# Patient Record
Sex: Female | Born: 1940 | Race: White | Hispanic: No | Marital: Married | Smoking: Former smoker
Health system: Southern US, Community
[De-identification: ages and names within clinical notes are randomized; demographics above are authoritative.]

## PROBLEM LIST (undated history)

## (undated) DIAGNOSIS — E039 Hypothyroidism, unspecified: Secondary | ICD-10-CM

## (undated) DIAGNOSIS — I10 Essential (primary) hypertension: Secondary | ICD-10-CM

## (undated) DIAGNOSIS — M1990 Unspecified osteoarthritis, unspecified site: Secondary | ICD-10-CM

## (undated) DIAGNOSIS — E7800 Pure hypercholesterolemia, unspecified: Secondary | ICD-10-CM

## (undated) DIAGNOSIS — M778 Other enthesopathies, not elsewhere classified: Secondary | ICD-10-CM

## (undated) HISTORY — DX: Essential (primary) hypertension: I10

## (undated) HISTORY — PX: KNEE SURGERY: SHX244

## (undated) HISTORY — PX: ABDOMINAL HYSTERECTOMY: SHX81

---

## 1997-07-18 ENCOUNTER — Other Ambulatory Visit: Admission: RE | Admit: 1997-07-18 | Discharge: 1997-07-18 | Payer: Self-pay | Admitting: *Deleted

## 1998-10-04 ENCOUNTER — Emergency Department (HOSPITAL_COMMUNITY): Admission: EM | Admit: 1998-10-04 | Discharge: 1998-10-04 | Payer: Self-pay | Admitting: Emergency Medicine

## 1998-10-04 ENCOUNTER — Encounter: Payer: Self-pay | Admitting: Emergency Medicine

## 1999-08-20 ENCOUNTER — Other Ambulatory Visit: Admission: RE | Admit: 1999-08-20 | Discharge: 1999-08-20 | Payer: Self-pay | Admitting: *Deleted

## 2001-02-24 HISTORY — PX: APPENDECTOMY: SHX54

## 2002-01-31 ENCOUNTER — Observation Stay (HOSPITAL_COMMUNITY): Admission: EM | Admit: 2002-01-31 | Discharge: 2002-02-01 | Payer: Self-pay | Admitting: Emergency Medicine

## 2002-01-31 ENCOUNTER — Encounter: Payer: Self-pay | Admitting: General Surgery

## 2005-10-12 ENCOUNTER — Emergency Department (HOSPITAL_COMMUNITY): Admission: EM | Admit: 2005-10-12 | Discharge: 2005-10-12 | Payer: Self-pay | Admitting: Emergency Medicine

## 2010-10-04 ENCOUNTER — Encounter (INDEPENDENT_AMBULATORY_CARE_PROVIDER_SITE_OTHER): Payer: Self-pay | Admitting: Surgery

## 2014-06-05 ENCOUNTER — Other Ambulatory Visit: Payer: Self-pay | Admitting: Orthopedic Surgery

## 2014-06-09 ENCOUNTER — Encounter (HOSPITAL_BASED_OUTPATIENT_CLINIC_OR_DEPARTMENT_OTHER): Payer: Self-pay | Admitting: *Deleted

## 2014-06-12 ENCOUNTER — Encounter (HOSPITAL_BASED_OUTPATIENT_CLINIC_OR_DEPARTMENT_OTHER)
Admission: RE | Admit: 2014-06-12 | Discharge: 2014-06-12 | Disposition: A | Discharge status: Home / Self Care | Payer: Medicare Other | Source: Ambulatory Visit | Attending: Orthopedic Surgery | Admitting: Orthopedic Surgery

## 2014-06-12 ENCOUNTER — Other Ambulatory Visit: Payer: Self-pay | Admitting: Internal Medicine

## 2014-06-12 ENCOUNTER — Ambulatory Visit (INDEPENDENT_AMBULATORY_CARE_PROVIDER_SITE_OTHER): Payer: Medicare Other | Admitting: Internal Medicine

## 2014-06-12 ENCOUNTER — Encounter: Payer: Self-pay | Admitting: Internal Medicine

## 2014-06-12 VITALS — BP 116/70 | HR 69 | Ht 62.0 in | Wt 143.4 lb

## 2014-06-12 DIAGNOSIS — Z791 Long term (current) use of non-steroidal anti-inflammatories (NSAID): Secondary | ICD-10-CM | POA: Diagnosis not present

## 2014-06-12 DIAGNOSIS — Z01818 Encounter for other preprocedural examination: Secondary | ICD-10-CM

## 2014-06-12 DIAGNOSIS — R0600 Dyspnea, unspecified: Secondary | ICD-10-CM | POA: Diagnosis not present

## 2014-06-12 DIAGNOSIS — R9431 Abnormal electrocardiogram [ECG] [EKG]: Secondary | ICD-10-CM | POA: Diagnosis not present

## 2014-06-12 DIAGNOSIS — R001 Bradycardia, unspecified: Secondary | ICD-10-CM | POA: Diagnosis not present

## 2014-06-12 DIAGNOSIS — E780 Pure hypercholesterolemia: Secondary | ICD-10-CM | POA: Diagnosis not present

## 2014-06-12 DIAGNOSIS — M7712 Lateral epicondylitis, left elbow: Secondary | ICD-10-CM | POA: Diagnosis not present

## 2014-06-12 DIAGNOSIS — M1990 Unspecified osteoarthritis, unspecified site: Secondary | ICD-10-CM | POA: Diagnosis not present

## 2014-06-12 DIAGNOSIS — M25522 Pain in left elbow: Secondary | ICD-10-CM | POA: Diagnosis present

## 2014-06-12 DIAGNOSIS — I10 Essential (primary) hypertension: Secondary | ICD-10-CM | POA: Diagnosis not present

## 2014-06-12 DIAGNOSIS — Z7982 Long term (current) use of aspirin: Secondary | ICD-10-CM | POA: Diagnosis not present

## 2014-06-12 DIAGNOSIS — Z79899 Other long term (current) drug therapy: Secondary | ICD-10-CM | POA: Diagnosis not present

## 2014-06-12 DIAGNOSIS — Z87891 Personal history of nicotine dependence: Secondary | ICD-10-CM | POA: Diagnosis not present

## 2014-06-12 DIAGNOSIS — E039 Hypothyroidism, unspecified: Secondary | ICD-10-CM | POA: Diagnosis not present

## 2014-06-12 LAB — CBC
HCT: 42.4 % (ref 36.0–46.0)
Hemoglobin: 14.2 g/dL (ref 12.0–15.0)
MCHC: 33.5 g/dL (ref 30.0–36.0)
MCV: 89.3 fl (ref 78.0–100.0)
Platelets: 244 10*3/uL (ref 150.0–400.0)
RBC: 4.75 Mil/uL (ref 3.87–5.11)
RDW: 14.7 % (ref 11.5–15.5)
WBC: 7.2 10*3/uL (ref 4.0–10.5)

## 2014-06-12 LAB — BASIC METABOLIC PANEL
Anion gap: 8 (ref 5–15)
BUN: 19 mg/dL (ref 6–23)
BUN: 22 mg/dL (ref 6–23)
CO2: 25 mmol/L (ref 19–32)
CO2: 26 mEq/L (ref 19–32)
Calcium: 9.8 mg/dL (ref 8.4–10.5)
Calcium: 10.6 mg/dL — ABNORMAL HIGH (ref 8.4–10.5)
Chloride: 107 mmol/L (ref 96–112)
Chloride: 106 mEq/L (ref 96–112)
Creatinine, Ser: 0.74 mg/dL (ref 0.50–1.10)
Creatinine, Ser: 0.77 mg/dL (ref 0.40–1.20)
GFR calc Af Amer: >90 mL/min (ref >90)
GFR calc non Af Amer: 81 mL/min — ABNORMAL LOW (ref >90)
GFR: 77.54 mL/min (ref >60.00)
Glucose, Bld: 107 mg/dL — ABNORMAL HIGH (ref 70–99)
Glucose, Bld: 101 mg/dL — ABNORMAL HIGH (ref 70–99)
Potassium: 4.7 mmol/L (ref 3.5–5.1)
Potassium: 3.9 mEq/L (ref 3.5–5.1)
SODIUM: 140 mmol/L (ref 135–145)
Sodium: 139 mEq/L (ref 135–145)

## 2014-06-12 LAB — BRAIN NATRIURETIC PEPTIDE: Pro B Natriuretic peptide (BNP): 52 pg/mL (ref 0.0–100.0)

## 2014-06-12 NOTE — Progress Notes (Signed)
Dr. Ivin Bootyrews reviewed EKG, spoke with pt about results of EKG that she has had an MI at some time, does not feel comfortable doing surgery until evaluated by cardiologist. [facility] [place] [facility] to [facility] for  before her surgery on Wednesday. Melissa called pt and then notified me around 11 am today that pt has appointment today at 2pm with Dr. Johney FrameAllred Cardiologist.

## 2014-06-12 NOTE — Patient Instructions (Signed)
We will be checking the following labs today BMET, CBC & CBC  We will arrange for a Lexiscan Myoview  If not able to do the stress test tomorrow, cancel your surgery  Stay on your current medicines  Call the [facility] office at [phone] if you have any questions, problems or concerns.

## 2014-06-12 NOTE — Progress Notes (Signed)
CARDIOLOGY OFFICE NOTE  Date:  06/12/2014    Rachel OchFaye Angelica Date of Birth: Dec 23, 1940 Medical Record #[id number]  PCP:  No primary care provider on file.  Cardiologist:  Allred (new)    Chief Complaint  Patient presents with  . Pre-op Exam    Work in visit for abnormal EKG - seen for Dr. Johney FrameAllred.      History of Present Illness: Rachel [name] is a 74 y.o. female who presents today for a work in visit/pre op clearance. New to Dr. Johney FrameAllred. She has a history of HTN and HLD along with hypothyroidism.  [place] Her daughter was in a group home here, now trying to relocate to another group home but to no avail. She has been under added stress with this. 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   She was to have elbow surgery on Wednesday with Dr. Luiz BlareGraves - she is not sure about what anesthesia. Has limited mobility of the left arm. EKG there with pre op today with possible inferior MI.  She has never seen cardiology. She does endorse a long history of dyspnea - attributed to deconditioning and weight gain. Says she can go about 90 feet and will get SOB. This is not new. Not getting worse. Has had some atypical left sided chest pain as well - this has been off and on. Says she thought she may have had an MI several years ago - did not seek emergent care - went to her PCP - referred for stress test and was told that she was ok. No recent labs. No regular exercise. Rare palpitation. No regular exercise program. Limited by knee and OA pain/discomforts.    Past Medical History  Diagnosis Date  . Hypothyroidism   . Arthritis   . Hypercholesteremia   . Tendonitis of elbow, left   . HTN (hypertension)     Past Surgical History  Procedure Laterality Date  . Appendectomy  2003  . Abdominal hysterectomy      1978     Medications: Current Outpatient Prescriptions    Medication Sig Dispense Refill  . aspirin 81 MG tablet Take 81 mg by mouth daily.    Marland Kitchen. levothyroxine (SYNTHROID, LEVOTHROID) 50 MCG tablet Take 100 mcg by mouth daily before breakfast.     . lisinopril (PRINIVIL,ZESTRIL) 10 MG tablet Take 10 mg by mouth daily.    . Naproxen Sodium 220 MG CAPS Take 1 capsule by mouth as needed.    Marland Kitchen. omeprazole (PRILOSEC) 20 MG capsule Take 20 mg by mouth as needed.    . pravastatin (PRAVACHOL) 40 MG tablet Take 40 mg by mouth daily.     No current facility-administered medications for this visit.    Allergies: Allergies  Allergen Reactions  . Codeine Palpitations  . Morphine And Related Palpitations    Social History: The patient  reports that she has quit smoking. She has never used smokeless tobacco. She reports that she drinks alcohol.   Family History: The patient's family history includes Heart attack (age of onset: 1776) in her father; Hypertension in her mother; Stroke (age of onset: 5390) in her mother.   Review of Systems: Please see the history of present illness.   Otherwise, the review of systems is positive for fatigue.   All other systems are reviewed and negative.   Physical Exam: VS:  BP 116/70 mmHg  Pulse 69  Ht  (1.575 m)  Wt 143 lb 6.4 oz (65.046 kg)  BMI 26.22 kg/m2 .  BMI Body mass index is 26.22 kg/(m^2).  Wt Readings from Last 3 Encounters:  06/12/14 143 lb 6.4 oz (65.046 kg)  06/09/14 142 lb (64.411 kg)    General: Pleasant. Well developed, well nourished and in no acute distress. Little tearful.  HEENT: Normal. Neck: Supple, no JVD, carotid bruits, or masses noted.  Cardiac: Regular rate and rhythm. No murmurs, rubs, or gallops. No edema.  Respiratory:  Lungs are clear to auscultation bilaterally with normal work of breathing.  GI: Soft and nontender.  MS: No deformity or atrophy. Gait and ROM intact. Skin: Warm and dry. Color is normal.  Neuro:  Strength and sensation are intact and no gross focal deficits  noted.  Psych: Alert, appropriate and with normal affect.   LABORATORY DATA:  EKG:  EKG is ordered today. This demonstrates NSR. Low voltage. No acute changes. Reviewed with Dr. Johney Frame (DOD).  Lab Results  Component Value Date   GLUCOSE 107* 06/12/2014   NA 140 06/12/2014   K 4.7 06/12/2014   CL 107 06/12/2014   CREATININE 0.74 06/12/2014   BUN 19 06/12/2014   CO2 25 06/12/2014    BNP (last 3 results) No results for input(s): BNP in the last 8760 hours.  ProBNP (last 3 results) No results for input(s): PROBNP in the last 8760 hours.   Other Studies Reviewed Today: N/A  Assessment/Plan: 1. Pre op clearance - EKG here in the office not all that impressive but she does endorse dyspnea and chest pain - will arrange for Lexiscan (not able to walk due to osteoarthritis). Will try to do tomorrow but her surgery may need to be postponed.   2. Atypical chest pain -  Arranging Myoview  3. DOE - checking lab today - arranging Myoview  4. HTN - controlled  5. HLD - on statin therapy  6. Situational stress - discussed at length.   She is asking about new PCP - I have given her the name and number of Dr. Cyndia Bent.   Current medicines are reviewed with the patient today.  The patient does not have concerns regarding medicines other than what has been noted above.  The following changes have been made:  See above.  Labs/ tests ordered today include:    Orders Placed This Encounter  Procedures  . Basic metabolic panel  . CBC  . Brain natriuretic peptide  . Myocardial Perfusion Imaging  . EKG 12-Lead     Disposition:   Further disposition to follow.  Patient is agreeable to this plan and will call if any problems develop in the interim.   Signed: Rosalio Macadamia, RN, ANP-C 06/12/2014 3:49 PM  [facility] [address] [place], [place]  [address] Phone: [phone] Fax: [phone]

## 2014-06-13 ENCOUNTER — Telehealth: Payer: Self-pay | Admitting: *Deleted

## 2014-06-13 ENCOUNTER — Encounter (HOSPITAL_COMMUNITY)
Admission: RE | Admit: 2014-06-13 | Discharge: 2014-06-13 | Disposition: A | Discharge status: Home / Self Care | Payer: Medicare Other | Source: Ambulatory Visit | Attending: Internal Medicine | Admitting: Internal Medicine

## 2014-06-13 ENCOUNTER — Other Ambulatory Visit: Payer: Self-pay

## 2014-06-13 ENCOUNTER — Encounter (HOSPITAL_COMMUNITY): Admission: RE | Admit: 2014-06-13 | Payer: Medicare Other | Source: Ambulatory Visit

## 2014-06-13 DIAGNOSIS — R0602 Shortness of breath: Secondary | ICD-10-CM

## 2014-06-13 DIAGNOSIS — R9431 Abnormal electrocardiogram [ECG] [EKG]: Secondary | ICD-10-CM | POA: Diagnosis not present

## 2014-06-13 MED ORDER — TECHNETIUM TC 99M SESTAMIBI GENERIC - CARDIOLITE
30.0000 | Freq: Once | INTRAVENOUS | Status: AC | PRN
  Administered 2014-06-13: 30 via INTRAVENOUS

## 2014-06-13 MED ORDER — TECHNETIUM TC 99M SESTAMIBI GENERIC - CARDIOLITE
10.0000 | Freq: Once | INTRAVENOUS | Status: AC | PRN
  Administered 2014-06-13: 10 via INTRAVENOUS

## 2014-06-13 MED ORDER — ACETAMINOPHEN 325 MG PO TABS
ORAL_TABLET | ORAL | Status: AC
  Filled 2014-06-13: qty 2

## 2014-06-13 MED ORDER — ACETAMINOPHEN 325 MG PO TABS
650.0000 mg | ORAL_TABLET | Freq: Once | ORAL | Status: AC
  Administered 2014-06-13: 650 mg via ORAL

## 2014-06-13 MED ORDER — REGADENOSON 0.4 MG/5ML IV SOLN: 0.4000 mg | Freq: Once | INTRAVENOUS | Status: DC

## 2014-06-13 MED ORDER — REGADENOSON 0.4 MG/5ML IV SOLN
INTRAVENOUS | Status: AC
  Administered 2014-06-13: 0.4 mg
  Filled 2014-06-13: qty 5

## 2014-06-13 NOTE — Progress Notes (Signed)
Lexiscan CL performed. Pt tolerated well.   Theodore Demarkhonda Barrett, PA-C 06/13/2014 10:09 AM Beeper [phone]

## 2014-06-13 NOTE — Telephone Encounter (Signed)
S/w pt is aware pt is cleared for surgical clearance tomorrow. Routed to Dr. Luiz BlareGraves.

## 2014-06-14 ENCOUNTER — Ambulatory Visit (HOSPITAL_BASED_OUTPATIENT_CLINIC_OR_DEPARTMENT_OTHER): Payer: Medicare Other | Admitting: Anesthesiology

## 2014-06-14 ENCOUNTER — Encounter (HOSPITAL_BASED_OUTPATIENT_CLINIC_OR_DEPARTMENT_OTHER): Payer: Self-pay | Admitting: Anesthesiology

## 2014-06-14 ENCOUNTER — Encounter (HOSPITAL_BASED_OUTPATIENT_CLINIC_OR_DEPARTMENT_OTHER)
Admission: RE | Disposition: A | Discharge status: Home / Self Care | Payer: Self-pay | Source: Ambulatory Visit | Attending: Orthopedic Surgery

## 2014-06-14 ENCOUNTER — Ambulatory Visit (HOSPITAL_BASED_OUTPATIENT_CLINIC_OR_DEPARTMENT_OTHER)
Admission: RE | Admit: 2014-06-14 | Discharge: 2014-06-14 | Disposition: A | Discharge status: Home / Self Care | Payer: Medicare Other | Source: Ambulatory Visit | Attending: Orthopedic Surgery | Admitting: Orthopedic Surgery

## 2014-06-14 DIAGNOSIS — Z79899 Other long term (current) drug therapy: Secondary | ICD-10-CM | POA: Insufficient documentation

## 2014-06-14 DIAGNOSIS — Z7982 Long term (current) use of aspirin: Secondary | ICD-10-CM | POA: Insufficient documentation

## 2014-06-14 DIAGNOSIS — Z87891 Personal history of nicotine dependence: Secondary | ICD-10-CM | POA: Insufficient documentation

## 2014-06-14 DIAGNOSIS — M7712 Lateral epicondylitis, left elbow: Secondary | ICD-10-CM | POA: Insufficient documentation

## 2014-06-14 DIAGNOSIS — E039 Hypothyroidism, unspecified: Secondary | ICD-10-CM | POA: Insufficient documentation

## 2014-06-14 DIAGNOSIS — E780 Pure hypercholesterolemia: Secondary | ICD-10-CM | POA: Diagnosis not present

## 2014-06-14 DIAGNOSIS — I10 Essential (primary) hypertension: Secondary | ICD-10-CM | POA: Insufficient documentation

## 2014-06-14 DIAGNOSIS — R9431 Abnormal electrocardiogram [ECG] [EKG]: Secondary | ICD-10-CM | POA: Insufficient documentation

## 2014-06-14 DIAGNOSIS — M1990 Unspecified osteoarthritis, unspecified site: Secondary | ICD-10-CM | POA: Diagnosis not present

## 2014-06-14 DIAGNOSIS — R001 Bradycardia, unspecified: Secondary | ICD-10-CM | POA: Insufficient documentation

## 2014-06-14 DIAGNOSIS — Z791 Long term (current) use of non-steroidal anti-inflammatories (NSAID): Secondary | ICD-10-CM | POA: Insufficient documentation

## 2014-06-14 HISTORY — DX: Hypothyroidism, unspecified: E03.9

## 2014-06-14 HISTORY — DX: Pure hypercholesterolemia, unspecified: E78.00

## 2014-06-14 HISTORY — PX: LATERAL EPICONDYLE RELEASE: SHX1958

## 2014-06-14 HISTORY — DX: Other enthesopathies, not elsewhere classified: M77.8

## 2014-06-14 HISTORY — DX: Unspecified osteoarthritis, unspecified site: M19.90

## 2014-06-14 SURGERY — TENNIS ELBOW RELEASE/NIRSCHEL PROCEDURE
Anesthesia: General | Laterality: Left

## 2014-06-14 MED ORDER — OXYCODONE HCL 5 MG/5ML PO SOLN: 5.0000 mg | Freq: Once | ORAL | Status: DC | PRN

## 2014-06-14 MED ORDER — PROPOFOL 10 MG/ML IV BOLUS
INTRAVENOUS | Status: DC | PRN
  Administered 2014-06-14: 150 mg via INTRAVENOUS

## 2014-06-14 MED ORDER — LIDOCAINE HCL (CARDIAC) 20 MG/ML IV SOLN
INTRAVENOUS | Status: DC | PRN
  Administered 2014-06-14: 60 mg via INTRAVENOUS

## 2014-06-14 MED ORDER — FENTANYL CITRATE (PF) 100 MCG/2ML IJ SOLN
INTRAMUSCULAR | Status: AC
  Filled 2014-06-14: qty 4

## 2014-06-14 MED ORDER — MIDAZOLAM HCL 2 MG/2ML IJ SOLN
INTRAMUSCULAR | Status: AC
  Filled 2014-06-14: qty 2

## 2014-06-14 MED ORDER — DEXAMETHASONE SODIUM PHOSPHATE 10 MG/ML IJ SOLN
INTRAMUSCULAR | Status: DC | PRN
  Administered 2014-06-14: 5 mg via INTRAVENOUS

## 2014-06-14 MED ORDER — MIDAZOLAM HCL 2 MG/2ML IJ SOLN
1.0000 mg | INTRAMUSCULAR | Status: DC | PRN
  Administered 2014-06-14: 1 mg via INTRAVENOUS

## 2014-06-14 MED ORDER — OXYCODONE-ACETAMINOPHEN 5-325 MG PO TABS: 1.0000 | ORAL_TABLET | Freq: Four times a day (QID) | ORAL | Status: AC | PRN

## 2014-06-14 MED ORDER — OXYCODONE HCL 5 MG PO TABS: 5.0000 mg | ORAL_TABLET | Freq: Once | ORAL | Status: DC | PRN

## 2014-06-14 MED ORDER — CEFAZOLIN SODIUM-DEXTROSE 2-3 GM-% IV SOLR
2.0000 g | INTRAVENOUS | Status: AC
  Administered 2014-06-14: 2 g via INTRAVENOUS

## 2014-06-14 MED ORDER — FENTANYL CITRATE (PF) 100 MCG/2ML IJ SOLN
INTRAMUSCULAR | Status: DC | PRN
  Administered 2014-06-14: 50 ug via INTRAVENOUS

## 2014-06-14 MED ORDER — CHLORHEXIDINE GLUCONATE 4 % EX LIQD: 60.0000 mL | Freq: Once | CUTANEOUS | Status: DC

## 2014-06-14 MED ORDER — PROMETHAZINE HCL 25 MG/ML IJ SOLN: 6.2500 mg | INTRAMUSCULAR | Status: DC | PRN

## 2014-06-14 MED ORDER — CEFAZOLIN SODIUM-DEXTROSE 2-3 GM-% IV SOLR
INTRAVENOUS | Status: AC
  Filled 2014-06-14: qty 50

## 2014-06-14 MED ORDER — HYDROMORPHONE HCL 1 MG/ML IJ SOLN
0.2500 mg | INTRAMUSCULAR | Status: DC | PRN
  Administered 2014-06-14: 0.5 mg via INTRAVENOUS
  Administered 2014-06-14 (×3): 0.25 mg via INTRAVENOUS

## 2014-06-14 MED ORDER — HYDROMORPHONE HCL 1 MG/ML IJ SOLN
INTRAMUSCULAR | Status: AC
  Filled 2014-06-14: qty 1

## 2014-06-14 MED ORDER — ONDANSETRON HCL 4 MG/2ML IJ SOLN
INTRAMUSCULAR | Status: DC | PRN
  Administered 2014-06-14: 4 mg via INTRAVENOUS

## 2014-06-14 MED ORDER — BUPIVACAINE HCL (PF) 0.5 % IJ SOLN
INTRAMUSCULAR | Status: DC | PRN
  Administered 2014-06-14: 10 mL

## 2014-06-14 MED ORDER — LACTATED RINGERS IV SOLN
INTRAVENOUS | Status: DC
  Administered 2014-06-14 (×2): via INTRAVENOUS

## 2014-06-14 SURGICAL SUPPLY — 68 items
APL SKNCLS STERI-STRIP NONHPOA (GAUZE/BANDAGES/DRESSINGS) ×1
BANDAGE ELASTIC 3 VELCRO ST LF (GAUZE/BANDAGES/DRESSINGS) ×2 IMPLANT
BANDAGE ELASTIC 4 VELCRO ST LF (GAUZE/BANDAGES/DRESSINGS) ×3 IMPLANT
BENZOIN TINCTURE PRP APPL 2/3 (GAUZE/BANDAGES/DRESSINGS) ×3 IMPLANT
BLADE SURG 15 STRL LF DISP TIS (BLADE) ×1 IMPLANT
BLADE SURG 15 STRL SS (BLADE) ×3
BNDG CMPR 9X4 STRL LF SNTH (GAUZE/BANDAGES/DRESSINGS) ×1
BNDG ESMARK 4X9 LF (GAUZE/BANDAGES/DRESSINGS) ×2 IMPLANT
CANISTER SUCT 1200ML W/VALVE (MISCELLANEOUS) IMPLANT
CLOSURE WOUND 1/2 X4 (GAUZE/BANDAGES/DRESSINGS) ×1
COVER BACK TABLE 60X90IN (DRAPES) ×3 IMPLANT
COVER MAYO STAND STRL (DRAPES) ×3 IMPLANT
CUFF TOURN SGL LL 18 NRW (TOURNIQUET CUFF) ×6 IMPLANT
CUFF TOURNIQUET SINGLE 18IN (TOURNIQUET CUFF) ×2 IMPLANT
DECANTER SPIKE VIAL GLASS SM (MISCELLANEOUS) ×3 IMPLANT
DRAPE EXTREMITY T 121X128X90 (DRAPE) ×3 IMPLANT
DRAPE SURG 17X23 STRL (DRAPES) ×2 IMPLANT
DRAPE U-SHAPE 47X51 STRL (DRAPES) ×3 IMPLANT
DRSG EMULSION OIL 3X3 NADH (GAUZE/BANDAGES/DRESSINGS) ×2 IMPLANT
DURAPREP 26ML APPLICATOR (WOUND CARE) ×3 IMPLANT
ELECT REM PT RETURN 9FT ADLT (ELECTROSURGICAL) ×3
ELECTRODE REM PT RTRN 9FT ADLT (ELECTROSURGICAL) ×1 IMPLANT
GAUZE SPONGE 4X4 12PLY STRL (GAUZE/BANDAGES/DRESSINGS) ×3 IMPLANT
GLOVE BIO SURGEON STRL SZ 6.5 (GLOVE) ×2 IMPLANT
GLOVE BIO SURGEONS STRL SZ 6.5 (GLOVE) ×2
GLOVE BIOGEL PI IND STRL 8 (GLOVE) ×2 IMPLANT
GLOVE BIOGEL PI INDICATOR 8 (GLOVE) ×4
GLOVE ECLIPSE 7.5 STRL STRAW (GLOVE) ×6 IMPLANT
GOWN STRL REUS W/ TWL LRG LVL3 (GOWN DISPOSABLE) ×1 IMPLANT
GOWN STRL REUS W/ TWL XL LVL3 (GOWN DISPOSABLE) ×1 IMPLANT
GOWN STRL REUS W/TWL LRG LVL3 (GOWN DISPOSABLE) ×3
GOWN STRL REUS W/TWL XL LVL3 (GOWN DISPOSABLE) ×6 IMPLANT
K-WIRE .045X4 (WIRE) IMPLANT
LOOP VESSEL MAXI BLUE (MISCELLANEOUS) IMPLANT
NEEDLE HYPO 22GX1.5 SAFETY (NEEDLE) ×3 IMPLANT
NS IRRIG 1000ML POUR BTL (IV SOLUTION) ×3 IMPLANT
PACK BASIN DAY SURGERY FS (CUSTOM PROCEDURE TRAY) ×3 IMPLANT
PAD CAST 4YDX4 CTTN HI CHSV (CAST SUPPLIES) ×2 IMPLANT
PADDING CAST ABS 4INX4YD NS (CAST SUPPLIES) ×2
PADDING CAST ABS COTTON 4X4 ST (CAST SUPPLIES) ×1 IMPLANT
PADDING CAST COTTON 4X4 STRL (CAST SUPPLIES) ×6
PENCIL BUTTON HOLSTER BLD 10FT (ELECTRODE) ×3 IMPLANT
SHEET MEDIUM DRAPE 40X70 STRL (DRAPES) ×2 IMPLANT
SLEEVE SCD COMPRESS KNEE MED (MISCELLANEOUS) ×2 IMPLANT
SLING ARM FOAM STRAP LRG (SOFTGOODS) ×2 IMPLANT
SLING ARM LRG ADULT FOAM STRAP (SOFTGOODS) IMPLANT
SLING ARM MED ADULT FOAM STRAP (SOFTGOODS) IMPLANT
SPLINT FAST PLASTER 5X30 (CAST SUPPLIES)
SPLINT FIBERGLASS 3X35 (CAST SUPPLIES) ×2 IMPLANT
SPLINT PLASTER CAST FAST 5X30 (CAST SUPPLIES) IMPLANT
SPONGE GAUZE 4X4 12PLY STER LF (GAUZE/BANDAGES/DRESSINGS) ×6 IMPLANT
STOCKINETTE 4X48 STRL (DRAPES) ×3 IMPLANT
STRIP CLOSURE SKIN 1/2X4 (GAUZE/BANDAGES/DRESSINGS) ×2 IMPLANT
SUCTION FRAZIER TIP 10 FR DISP (SUCTIONS) IMPLANT
SUT MNCRL AB 3-0 PS2 18 (SUTURE) IMPLANT
SUT MNCRL AB 4-0 PS2 18 (SUTURE) ×2 IMPLANT
SUT VIC AB 0 CT1 27 (SUTURE)
SUT VIC AB 0 CT1 27XBRD ANBCTR (SUTURE) IMPLANT
SUT VIC AB 3-0 SH 27 (SUTURE)
SUT VIC AB 3-0 SH 27X BRD (SUTURE) IMPLANT
SYR BULB 3OZ (MISCELLANEOUS) ×2 IMPLANT
SYR CONTROL 10ML LL (SYRINGE) ×3 IMPLANT
TOWEL OR 17X24 6PK STRL BLUE (TOWEL DISPOSABLE) ×3 IMPLANT
TOWEL OR NON WOVEN STRL DISP B (DISPOSABLE) ×5 IMPLANT
TUBE CONNECTING 20'X1/4 (TUBING)
TUBE CONNECTING 20X1/4 (TUBING) IMPLANT
UNDERPAD 30X30 INCONTINENT (UNDERPADS AND DIAPERS) ×3 IMPLANT
YANKAUER SUCT BULB TIP NO VENT (SUCTIONS) IMPLANT

## 2014-06-14 NOTE — Op Note (Signed)
[name]:  [name], Rachel [name]                 ACCOUNT NO.:  [id number]  MEDICAL RECORD NO.:  [id number]  LOCATION:                               FACILITY:  [facility]  PHYSICIAN:  Harvie JuniorJohn L. Takeya Marquis, M.D.   DATE OF BIRTH:  07-28-1940  DATE OF PROCEDURE:  06/14/2014 DATE OF DISCHARGE:  06/14/2014                              OPERATIVE REPORT   PREOPERATIVE DIAGNOSIS:  Lateral epicondylitis.  POSTOPERATIVE DIAGNOSIS:  Lateral epicondylitis.  PROCEDURE:  Lateral epicondylar release.  SURGEON:  Harvie JuniorJohn L. Ronny Korff, M.D.  Threasa HeadsASSISTANOrma Flaming:  Bethune.  ANESTHESIA:  General.  BRIEF HISTORY:  Rachel [name] is a 74 year old female with a long history of significant complaints of left elbow pain.  She has been treated conservatively for a period of time.  She has had injection therapy, activity modification, all of these did not help her elbow pain. Because of continued elbow pain and failure of conservative care, she was taken to the operating room for lateral epicondylar release.  DESCRIPTION OF PROCEDURE:  The patient was taken to the operating room. After adequate anesthesia was obtained with general anesthetic, the patient was placed supine on the operating table.  Left arm was prepped and draped in usual sterile fashion.  Following this, the arm was exsanguinated, blood pressure tourniquet inflated to 250 mmHg. Following this, an incision was made over the lateral epicondylar area, subcutaneous tissue, and then over the common extensor tendon.  The palpable bony prominence of the epicondyle was identified and an incision was made in the common extensor tendon in its mid portion. Flaps were raised proximally and distally and the ECRB tendon was identified underneath the outer layer.  There was significant gelatinous material in the ECRB tendon.  We then went directly into the elbow joint and irrigated this thoroughly.  We then took a rongeur and debrided the lateral epicondylar bone down to an excellent bleeding  bed of bone. Once this was done, the common extensor tendon was reapproximated over this lateral epicondylar area and the skin was closed with combination of 0 and 2-0 Vicryl and 3-0 Monocryl subcuticular suture.  Benzoin, Steri-Strips were applied.  Sterile compressive dressing was applied.  The patient was taken to the recovery room, she was noted to be in satisfactory condition.  We did put her in a long arm splint.     Harvie JuniorJohn L. Verdun Rackley, M.D.     Ranae PlumberJLG/MEDQ  D:  06/14/2014  T:  06/14/2014  Job:  [id number]  cc:   Harvie JuniorJohn L. Genita Nilsson, M.D.

## 2014-06-14 NOTE — Brief Op Note (Signed)
06/14/2014  9:14 AM  PATIENT:  Rachel [name]  74 y.o. female  PRE-OPERATIVE DIAGNOSIS:  left elbow tendonitis  POST-OPERATIVE DIAGNOSIS:  left elbow tendonitis  PROCEDURE:  Procedure(s): TENNIS ELBOW RELEASE/NIRSCHEL PROCEDURE (Left)  SURGEON:  Surgeon(s) and Role:    * Jodi GeraldsJohn Liana Camerer, MD - Primary  PHYSICIAN ASSISTANT:   ASSISTANTS: bethune   ANESTHESIA:   none  EBL:     BLOOD ADMINISTERED:none  DRAINS: none   LOCAL MEDICATIONS USED:  MARCAINE     SPECIMEN:  No Specimen  DISPOSITION OF SPECIMEN:  N/A  COUNTS:  YES  TOURNIQUET:  * Missing tourniquet times found for documented tourniquets in log:  [id number] *  DICTATION: .Other Dictation: Dictation Number [id number]  PLAN OF CARE: Discharge to home after PACU  PATIENT DISPOSITION:  PACU - hemodynamically stable.   Delay start of Pharmacological VTE agent (>24hrs) due to surgical blood loss or risk of bleeding: no

## 2014-06-14 NOTE — Anesthesia Postprocedure Evaluation (Signed)
  Anesthesia Post-op Note  Patient: Rachel [name]  Procedure(s) Performed: Procedure(s): TENNIS ELBOW RELEASE/NIRSCHEL PROCEDURE (Left)  Patient Location: PACU  Anesthesia Type:General  Level of Consciousness: awake and alert   Airway and Oxygen Therapy: Patient Spontanous Breathing  Post-op Pain: mild  Post-op Assessment: Post-op Vital signs reviewed  Post-op Vital Signs: Reviewed  Last Vitals:  Filed Vitals:   06/14/14 1123  BP:   Pulse: 57  Temp:   Resp: 16    Complications: No apparent anesthesia complications

## 2014-06-14 NOTE — H&P (Signed)
PREOPERATIVE H&P  Chief Complaint:  Left elbow pain  HPI: Rachel [name] is a 74 y.o. female who presents for evaluation of l elbow pain. It has been present for greater than 3 months and has been worsening. She has failed conservative measures. Pain is rated as moderate.  Past Medical History  Diagnosis Date  . Hypothyroidism   . Arthritis   . Hypercholesteremia   . Tendonitis of elbow, left   . HTN (hypertension)    Past Surgical History  Procedure Laterality Date  . Appendectomy  2003  . Abdominal hysterectomy      1978   History   Social History  . Marital Status: Married    Spouse Name: N/A  . Number of Children: N/A  . Years of Education: N/A   Social History Main Topics  . Smoking status: Former Smoker -- 12 years  . Smokeless tobacco: Never Used  . Alcohol Use: 0.0 oz/week    0 Standard drinks or equivalent per week  . Drug Use: Not on file  . Sexual Activity: Not Currently   Other Topics Concern  . None   Social History Narrative   Family History  Problem Relation Age of Onset  . Stroke Mother 5790  . Hypertension Mother   . Heart attack Father 1876   Allergies  Allergen Reactions  . Codeine Palpitations  . Morphine And Related Palpitations   Prior to Admission medications   Medication Sig Start Date End Date Taking? Authorizing Provider  aspirin 81 MG tablet Take 81 mg by mouth daily.   Yes Historical Provider, MD  levothyroxine (SYNTHROID, LEVOTHROID) 50 MCG tablet Take 100 mcg by mouth daily before breakfast.    Yes Historical Provider, MD  lisinopril (PRINIVIL,ZESTRIL) 10 MG tablet Take 10 mg by mouth daily.   Yes Historical Provider, MD  omeprazole (PRILOSEC) 20 MG capsule Take 20 mg by mouth as needed.   Yes Historical Provider, MD  Naproxen Sodium 220 MG CAPS Take 1 capsule by mouth as needed.    Historical Provider, MD  pravastatin (PRAVACHOL) 40 MG tablet Take 40 mg by mouth daily.    Historical Provider, MD     Positive ROS: none  All  other systems have been reviewed and were otherwise negative with the exception of those mentioned in the HPI and as above.  Physical Exam: Filed Vitals:   06/14/14 0709  BP: 117/60  Pulse: 64  Temp: 97.5 F (36.4 C)  Resp: 16    General: Alert, no acute distress Cardiovascular: No pedal edema Respiratory: No cyanosis, no use of accessory musculature GI: No organomegaly, abdomen is soft and non-tender Skin: No lesions in the area of chief complaint Neurologic: Sensation intact distally Psychiatric: Patient is competent for consent with normal mood and affect Lymphatic: No axillary or cervical lymphadenopathy  MUSCULOSKELETAL: l elbow ttp over lateral aspect pain with extension  Assessment/Plan: left elbow tendonitis Plan for Procedure(s): TENNIS ELBOW RELEASE/NIRSCHEL PROCEDURE  The risks benefits and alternatives were discussed with the patient including but not limited to the risks of nonoperative treatment, versus surgical intervention including infection, bleeding, nerve injury, malunion, nonunion, hardware prominence, hardware failure, need for hardware removal, blood clots, cardiopulmonary complications, morbidity, mortality, among others, and they were willing to proceed.  Predicted outcome is good, although there will be at least a six to nine month expected recovery.  Fahed Morten L, MD 06/14/2014 8:00 AM

## 2014-06-14 NOTE — Op Note (Deleted)
NAME:  Sifuentes, Elia                 ACCOUNT NO.:  [id number]  MEDICAL RECORD NO.:  [id number]  LOCATION:                               FACILITY:  [facility]  PHYSICIAN:  Keyvon Herter L. Jeremaine Maraj, M.D.   DATE OF BIRTH:  02/11/1941  DATE OF PROCEDURE:  06/14/2014 DATE OF DISCHARGE:  06/14/2014                              OPERATIVE REPORT   PREOPERATIVE DIAGNOSIS:  Lateral epicondylitis.  POSTOPERATIVE DIAGNOSIS:  Lateral epicondylitis.  PROCEDURE:  Lateral epicondylar release.  SURGEON:  Marshea Wisher L. Seann Genther, M.D.  ASSISTANT:  Bethune.  ANESTHESIA:  General.  BRIEF HISTORY:  Ms. Lowder is a 73-year-old female with a long history of significant complaints of left elbow pain.  She has been treated conservatively for a period of time.  She has had injection therapy, activity modification, all of these did not help her elbow pain. Because of continued elbow pain and failure of conservative care, she was taken to the operating room for lateral epicondylar release.  DESCRIPTION OF PROCEDURE:  The patient was taken to the operating room. After adequate anesthesia was obtained with general anesthetic, the patient was placed supine on the operating table.  Left arm was prepped and draped in usual sterile fashion.  Following this, the arm was exsanguinated, blood pressure tourniquet inflated to 250 mmHg. Following this, an incision was made over the lateral epicondylar area, subcutaneous tissue, and then over the common extensor tendon.  The palpable bony prominence of the epicondyle was identified and an incision was made in the common extensor tendon in its mid portion. Flaps were raised proximally and distally and the ECRB tendon was identified underneath the outer layer.  There was significant gelatinous material in the ECRB tendon.  We then went directly into the elbow joint and irrigated this thoroughly.  We then took a rongeur and debrided the lateral epicondylar bone down to an excellent bleeding  bed of bone. Once this was done, the common extensor tendon was reapproximated over this lateral epicondylar area and the skin was closed with combination of 0 and 2-0 Vicryl and 3-0 Monocryl subcuticular suture.  Benzoin, Steri-Strips were applied.  Sterile compressive dressing was applied.  The patient was taken to the recovery room, she was noted to be in satisfactory condition.  We did put her in a long arm splint.     Tydarius Yawn L. Mckinley Adelstein, M.D.     JLG/MEDQ  D:  06/14/2014  T:  06/14/2014  Job:  [id number]  cc:   Jaryn Rosko L. Neri Vieyra, M.D. 

## 2014-06-14 NOTE — Discharge Instructions (Signed)
Apply ice to elbow as much as possible 2 days. You may wiggle your fingers as tolerated. Wear sling. Use pain medicine as needed. You can take ibuprofen for mild pain.   Post Anesthesia Home Care Instructions  Activity: Get plenty of rest for the remainder of the day. A responsible adult should stay with you for 24 hours following the procedure.  For the next 24 hours, DO NOT: -Drive a car -Advertising copywriterperate machinery -Drink alcoholic beverages -Take any medication unless instructed by your physician -Make any legal decisions or sign important papers.  Meals: Start with liquid foods such as gelatin or soup. Progress to regular foods as tolerated. Avoid greasy, spicy, heavy foods. If nausea and/or vomiting occur, drink only clear liquids until the nausea and/or vomiting subsides. Call your physician if vomiting continues.  Special Instructions/Symptoms: Your throat may feel dry or sore from the anesthesia or the breathing tube placed in your throat during surgery. If this causes discomfort, gargle with warm salt water. The discomfort should disappear within 24 hours.  If you had a scopolamine patch placed behind your ear for the management of post- operative nausea and/or vomiting:  1. The medication in the patch is effective for 72 hours, after which it should be removed.  Wrap patch in a tissue and discard in the trash. Wash hands thoroughly with soap and water. 2. You may remove the patch earlier than 72 hours if you experience unpleasant side effects which may include dry mouth, dizziness or visual disturbances. 3. Avoid touching the patch. Wash your hands with soap and water after contact with the patch.

## 2014-06-14 NOTE — Anesthesia Preprocedure Evaluation (Addendum)
Anesthesia Evaluation  Patient identified by MRN, date of birth, ID band Patient awake    Reviewed: Allergy & Precautions, NPO status , Patient's Chart, lab work & pertinent test results  Airway Mallampati: II  TM Distance: >3 FB Neck ROM: Full    Dental  (+) Teeth Intact, Dental Advisory Given   Pulmonary former smoker,  breath sounds clear to auscultation        Cardiovascular hypertension, Pt. on medications Rhythm:Regular Rate:Normal  06/13/14- normal myoview   Neuro/Psych negative neurological ROS     GI/Hepatic negative GI ROS, Neg liver ROS,   Endo/Other  Hypothyroidism   Renal/GU negative Renal ROS     Musculoskeletal  (+) Arthritis -,   Abdominal   Peds  Hematology negative hematology ROS (+)   Anesthesia Other Findings   Reproductive/Obstetrics                            Anesthesia Physical Anesthesia Plan  ASA: II  Anesthesia Plan: General   Post-op Pain Management:    Induction: Intravenous  Airway Management Planned: LMA  Additional Equipment:   Intra-op Plan:   Post-operative Plan:   Informed Consent: I have reviewed the patients History and Physical, chart, labs and discussed the procedure including the risks, benefits and alternatives for the proposed anesthesia with the patient or authorized representative who has indicated his/her understanding and acceptance.   Dental advisory given  Plan Discussed with: CRNA  Anesthesia Plan Comments: (Pt declined pre-op block but agreed to post-op block if pain control inadequate. Risks and benefits discussed. Plan GETA with LMA, standard monitors.)       Anesthesia Quick Evaluation

## 2014-06-14 NOTE — Anesthesia Procedure Notes (Signed)
Procedure Name: LMA Insertion Date/Time: 06/14/2014 8:39 AM Performed by: Burna CashONRAD, Lovene Maret C Pre-anesthesia Checklist: Patient identified, Emergency Drugs available, Suction available and Patient being monitored Patient Re-evaluated:Patient Re-evaluated prior to inductionOxygen Delivery Method: Circle System Utilized Preoxygenation: Pre-oxygenation with 100% oxygen Intubation Type: IV induction Ventilation: Mask ventilation without difficulty LMA: LMA inserted LMA Size: 4.0 Number of attempts: 1 Airway Equipment and Method: Bite block Placement Confirmation: positive ETCO2 Tube secured with: Tape Dental Injury: Teeth and Oropharynx as per pre-operative assessment

## 2014-06-14 NOTE — Transfer of Care (Signed)
Immediate Anesthesia Transfer of Care Note  Patient: Rachel [name]  Procedure(s) Performed: Procedure(s): TENNIS ELBOW RELEASE/NIRSCHEL PROCEDURE (Left)  Patient Location: PACU  Anesthesia Type:General  Level of Consciousness: sedated  Airway & Oxygen Therapy: Patient Spontanous Breathing and Patient connected to face mask oxygen  Post-op Assessment: Report given to RN and Post -op Vital signs reviewed and stable  Post vital signs: Reviewed and stable  Last Vitals:  Filed Vitals:   06/14/14 0932  BP:   Pulse: 57  Temp:   Resp: 18    Complications: No apparent anesthesia complications

## 2014-06-16 ENCOUNTER — Encounter (HOSPITAL_BASED_OUTPATIENT_CLINIC_OR_DEPARTMENT_OTHER): Payer: Self-pay | Admitting: Orthopedic Surgery

## 2015-01-08 ENCOUNTER — Other Ambulatory Visit: Payer: Self-pay | Admitting: Obstetrics and Gynecology

## 2015-01-08 DIAGNOSIS — N644 Mastodynia: Secondary | ICD-10-CM

## 2015-01-23 ENCOUNTER — Other Ambulatory Visit: Payer: Self-pay | Admitting: Obstetrics and Gynecology

## 2015-01-23 DIAGNOSIS — N644 Mastodynia: Secondary | ICD-10-CM

## 2016-01-24 ENCOUNTER — Emergency Department (HOSPITAL_COMMUNITY): Payer: Medicare Other

## 2016-01-24 ENCOUNTER — Encounter (HOSPITAL_COMMUNITY): Payer: Self-pay | Admitting: Emergency Medicine

## 2016-01-24 DIAGNOSIS — W010XXA Fall on same level from slipping, tripping and stumbling without subsequent striking against object, initial encounter: Secondary | ICD-10-CM | POA: Diagnosis not present

## 2016-01-24 DIAGNOSIS — I10 Essential (primary) hypertension: Secondary | ICD-10-CM | POA: Insufficient documentation

## 2016-01-24 DIAGNOSIS — M25511 Pain in right shoulder: Secondary | ICD-10-CM | POA: Diagnosis not present

## 2016-01-24 DIAGNOSIS — Y939 Activity, unspecified: Secondary | ICD-10-CM | POA: Insufficient documentation

## 2016-01-24 DIAGNOSIS — Y929 Unspecified place or not applicable: Secondary | ICD-10-CM | POA: Diagnosis not present

## 2016-01-24 DIAGNOSIS — Z79899 Other long term (current) drug therapy: Secondary | ICD-10-CM | POA: Insufficient documentation

## 2016-01-24 DIAGNOSIS — S8001XA Contusion of right knee, initial encounter: Secondary | ICD-10-CM | POA: Diagnosis not present

## 2016-01-24 DIAGNOSIS — Y999 Unspecified external cause status: Secondary | ICD-10-CM | POA: Insufficient documentation

## 2016-01-24 DIAGNOSIS — E039 Hypothyroidism, unspecified: Secondary | ICD-10-CM | POA: Insufficient documentation

## 2016-01-24 DIAGNOSIS — Z87891 Personal history of nicotine dependence: Secondary | ICD-10-CM | POA: Insufficient documentation

## 2016-01-24 DIAGNOSIS — Z7982 Long term (current) use of aspirin: Secondary | ICD-10-CM | POA: Insufficient documentation

## 2016-01-24 DIAGNOSIS — S8991XA Unspecified injury of right lower leg, initial encounter: Secondary | ICD-10-CM | POA: Diagnosis present

## 2016-01-24 NOTE — ED Triage Notes (Signed)
Patient tripped on a laundry basket and fell this evening , denies LOC , reports pain at right shoulder joint and right knee. Alert and oriented/ respirations unlabored .

## 2016-01-25 ENCOUNTER — Emergency Department (HOSPITAL_COMMUNITY)
Admission: EM | Admit: 2016-01-25 | Discharge: 2016-01-25 | Disposition: A | Discharge status: Home / Self Care | Payer: Medicare Other | Attending: Emergency Medicine | Admitting: Emergency Medicine

## 2016-01-25 DIAGNOSIS — S8001XA Contusion of right knee, initial encounter: Secondary | ICD-10-CM

## 2016-01-25 DIAGNOSIS — W19XXXA Unspecified fall, initial encounter: Secondary | ICD-10-CM

## 2016-01-25 DIAGNOSIS — M25511 Pain in right shoulder: Secondary | ICD-10-CM

## 2016-01-25 MED ORDER — CYCLOBENZAPRINE HCL 10 MG PO TABS: 10.0000 mg | ORAL_TABLET | Freq: Two times a day (BID) | ORAL | 0 refills | Status: AC | PRN

## 2016-01-25 MED ORDER — NAPROXEN 375 MG PO TABS: 375.0000 mg | ORAL_TABLET | Freq: Two times a day (BID) | ORAL | 0 refills | Status: AC

## 2016-01-25 NOTE — ED Provider Notes (Signed)
[facility]-EMERGENCY DEPT Provider Note   CSN: [id number] Arrival date & time: 01/24/16  2215     History   Chief Complaint Chief Complaint  Patient presents with  . Fall    HPI Rachel [name] is a 75 y.o. female.  Patient fell after tripping over a laundry basket and landing on her right knee. She tried to catch herself with her right hand and then fell onto the right shoulder as well. No head injury. No neck, abdominal or chest pain. She has been ambulatory since the fall. No nausea or vomiting. She has movement of all joints but states the worst pain is in the shoulder.    The history is provided by the patient, the spouse and a relative. No language interpreter was used.  Fall  This is a new problem. Pertinent negatives include no chest pain, no abdominal pain and no headaches.    Past Medical History:  Diagnosis Date  . Arthritis   . HTN (hypertension)   . Hypercholesteremia   . Hypothyroidism   . Tendonitis of elbow, left     There are no active problems to display for this patient.   Past Surgical History:  Procedure Laterality Date  . ABDOMINAL HYSTERECTOMY     1978  . APPENDECTOMY  2003  . KNEE SURGERY    . LATERAL EPICONDYLE RELEASE Left 06/14/2014   Procedure: TENNIS ELBOW RELEASE/NIRSCHEL PROCEDURE;  Surgeon: Jodi GeraldsJohn Graves, MD;  Location: [facility];  Service: Orthopedics;  Laterality: Left;    OB History    No data available       Home Medications    Prior to Admission medications   Medication Sig Start Date End Date Taking? Authorizing Provider  aspirin 81 MG tablet Take 81 mg by mouth daily.   Yes Historical Provider, MD  levothyroxine (SYNTHROID, LEVOTHROID) 100 MCG tablet Take 100 mcg by mouth daily before breakfast.   Yes Historical Provider, MD  lisinopril (PRINIVIL,ZESTRIL) 10 MG tablet Take 10 mg by mouth daily.   Yes Historical Provider, MD  Multiple Vitamin (MULTIVITAMIN WITH MINERALS) TABS tablet Take 1 tablet by mouth daily.    Yes Historical Provider, MD  omeprazole (PRILOSEC) 20 MG capsule Take 20 mg by mouth daily.    Yes Historical Provider, MD  pravastatin (PRAVACHOL) 40 MG tablet Take 40 mg by mouth daily.   Yes Historical Provider, MD  oxyCODONE-acetaminophen (PERCOCET/ROXICET) 5-325 MG per tablet Take 1-2 tablets by mouth every 6 (six) hours as needed for severe pain. Patient not taking: Reported on 01/25/2016 06/14/14   Marshia LyJames Bethune, PA-C    Family History Family History  Problem Relation Age of Onset  . Stroke Mother 2390  . Hypertension Mother   . Heart attack Father [id number]    Social History Social History  Substance Use Topics  . Smoking status: Former Smoker    Years: 12.00  . Smokeless tobacco: Never Used  . Alcohol use 0.0 oz/week     Allergies   Codeine and Morphine and related   Review of Systems Review of Systems  Constitutional: Negative for fever.  Cardiovascular: Negative.  Negative for chest pain.  Gastrointestinal: Negative.  Negative for abdominal pain and nausea.  Musculoskeletal:       See HPI.  Skin: Negative.  Negative for wound.  Neurological: Negative.  Negative for headaches.     Physical Exam Updated Vital Signs BP 130/70   Pulse 76   Temp 97.9 F (36.6 C) (Oral)   Resp 16  SpO2 96%   Physical Exam  Constitutional: She is oriented to person, place, and time. She appears well-developed and well-nourished. No distress.  HENT:  Head: Normocephalic and atraumatic.  Eyes: Conjunctivae are normal.  Neck: Normal range of motion. Neck supple.  Cardiovascular: Intact distal pulses.   Pulmonary/Chest: Effort normal.  Abdominal: Soft. There is no tenderness.  Musculoskeletal:  Right shoulder has no significant swelling or deformity. She maintains a full ROM. Tender over humeral head. No clavicular, scapular or elbow tenderness. Right knee has minor anterior swelling without bruising. FROM of joint that is pain free. No calf or thigh tenderness. No midline cervical  tenderness.   Neurological: She is alert and oriented to person, place, and time.  Skin: Skin is warm.  Psychiatric: She has a normal mood and affect.     ED Treatments / Results  Labs (all labs ordered are listed, but only abnormal results are displayed) Labs Reviewed - No data to display  EKG  EKG Interpretation None       Radiology Dg Shoulder Right  Result Date: 01/24/2016 CLINICAL DATA:  Status post fall, with right shoulder pain. Initial encounter. EXAM: RIGHT SHOULDER - 2+ VIEW COMPARISON:  None. FINDINGS: There is no evidence of fracture or dislocation. The right humeral head is seated within the glenoid fossa. The acromioclavicular joint is unremarkable in appearance. No significant soft tissue abnormalities are seen. The visualized portions of the right lung are clear. IMPRESSION: No evidence of fracture or dislocation. Electronically Signed   By: Roanna RaiderJeffery  Chang M.D.   On: 01/24/2016 23:46   Dg Knee Complete 4 Views Right  Result Date: 01/24/2016 CLINICAL DATA:  Status post fall, with right knee pain. Initial encounter. EXAM: RIGHT KNEE - COMPLETE 4+ VIEW COMPARISON:  None. FINDINGS: There is no evidence of fracture or dislocation. The joint spaces are preserved. No significant degenerative change is seen. There is chronic deformity of the patella. No significant joint effusion is seen. The visualized soft tissues are normal in appearance. IMPRESSION: No evidence of fracture or dislocation. Electronically Signed   By: Roanna RaiderJeffery  Chang M.D.   On: 01/24/2016 23:47    Procedures Procedures (including critical care time)  Medications Ordered in ED Medications - No data to display   Initial Impression / Assessment and Plan / ED Course  I have reviewed the triage vital signs and the nursing notes.  Pertinent labs & imaging results that were available during my care of the patient were reviewed by me and considered in my medical decision making (see chart for  details).  Clinical Course     Patient presents after mechanical fall with right shoulder and knee pain. Negative x-rays. Ambulatory. She is well appearing. Do not suspect internal or head injury. Not on blood thinners. She is examined by Dr. Manus Gunningancour and found appropriate for discharge home.   Final Clinical Impressions(s) / ED Diagnoses   Final diagnoses:  None   1. Mechanical fall 2. Contusion knee 3. Right shoulder pain  New Prescriptions New Prescriptions   No medications on file     Elpidio AnisShari Johnattan Strassman, Cordelia Poche-C 01/25/16 [id number]    Glynn OctaveStephen Rancour, MD 01/25/16 [phone]

## 2018-05-19 IMAGING — CR DG SHOULDER 2+V*R*
2 series · 2 of 2 positions shown · non-contrast
Comparison: None.

CLINICAL DATA: Status post fall, with right shoulder pain. Initial
encounter.

EXAM:
RIGHT SHOULDER - 2+ VIEW

[shoulder grashey]
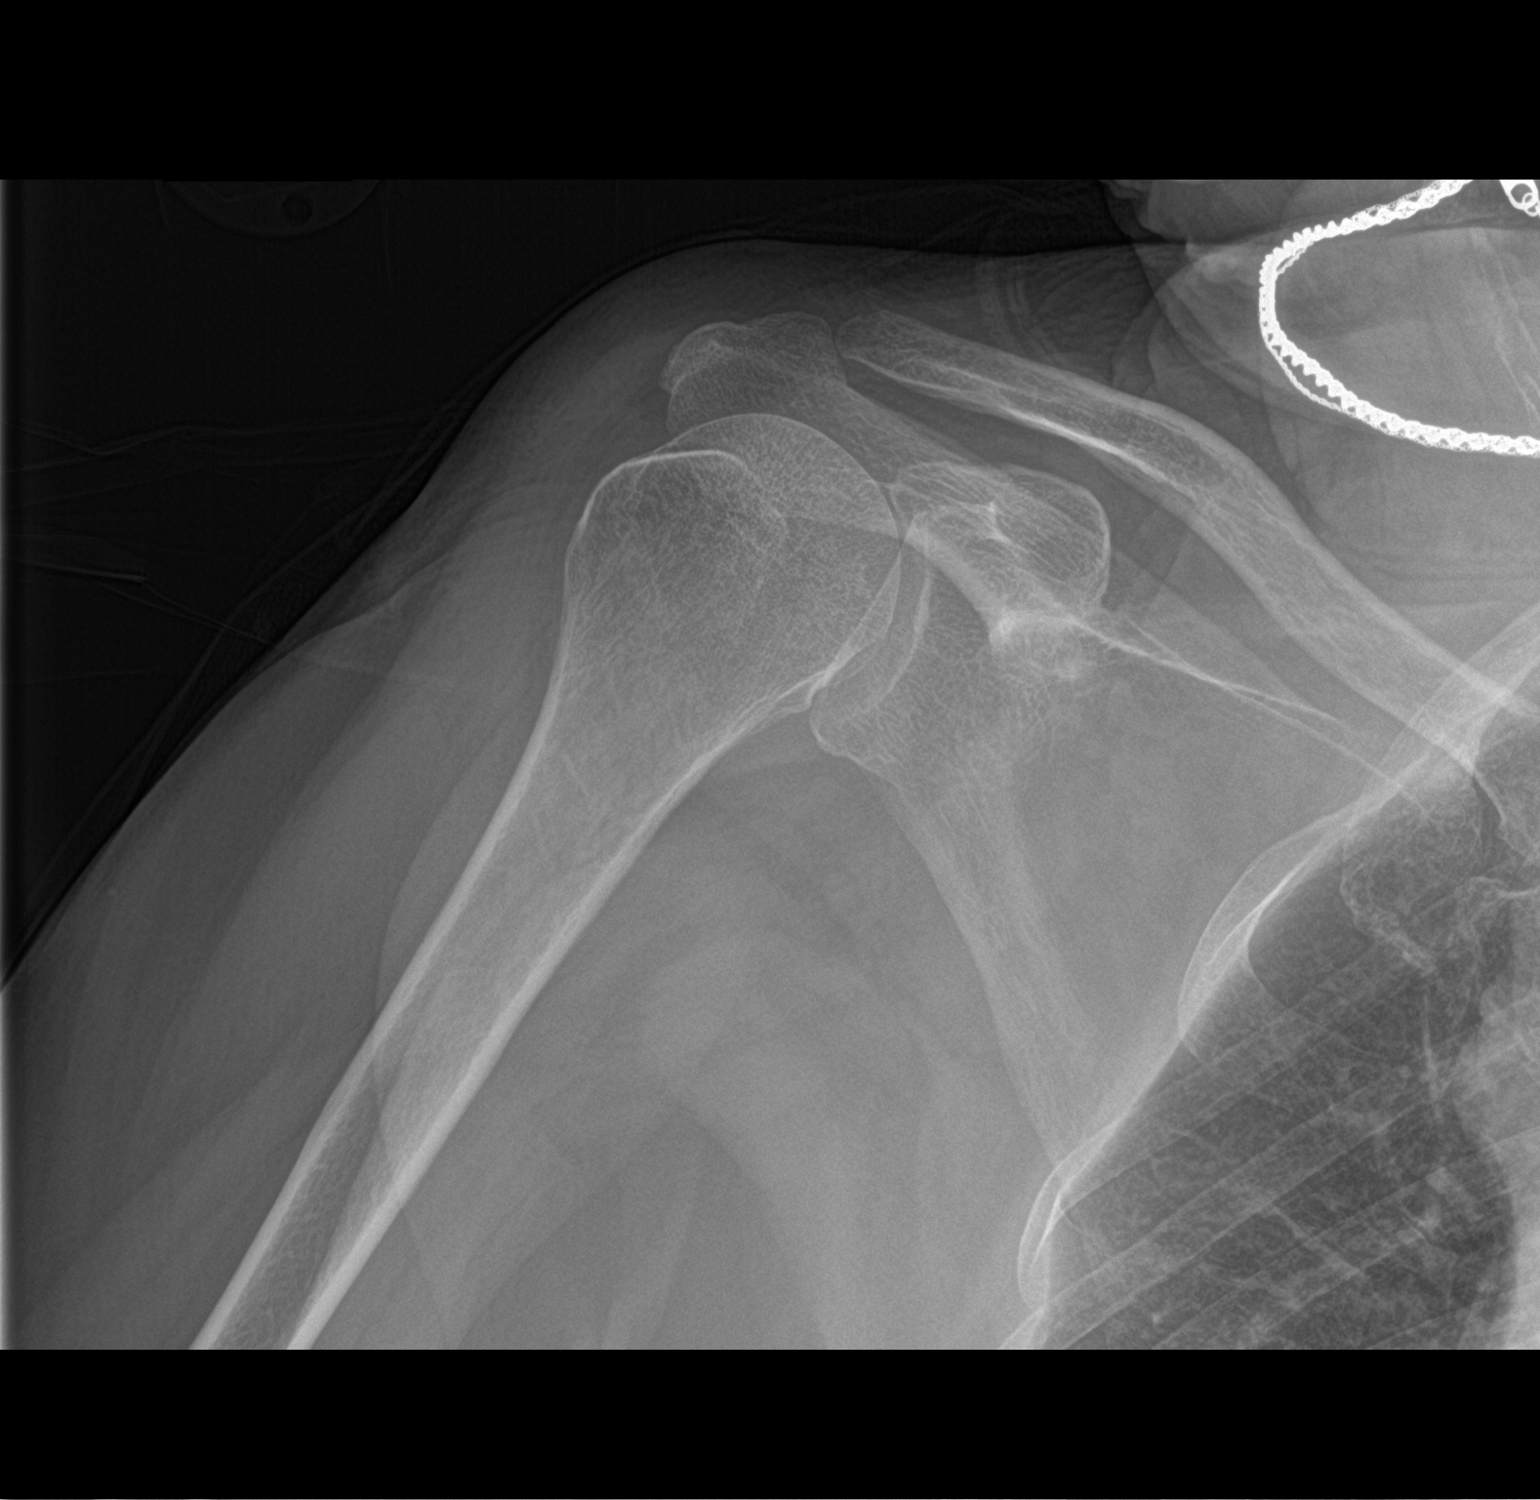

[shoulder y view]
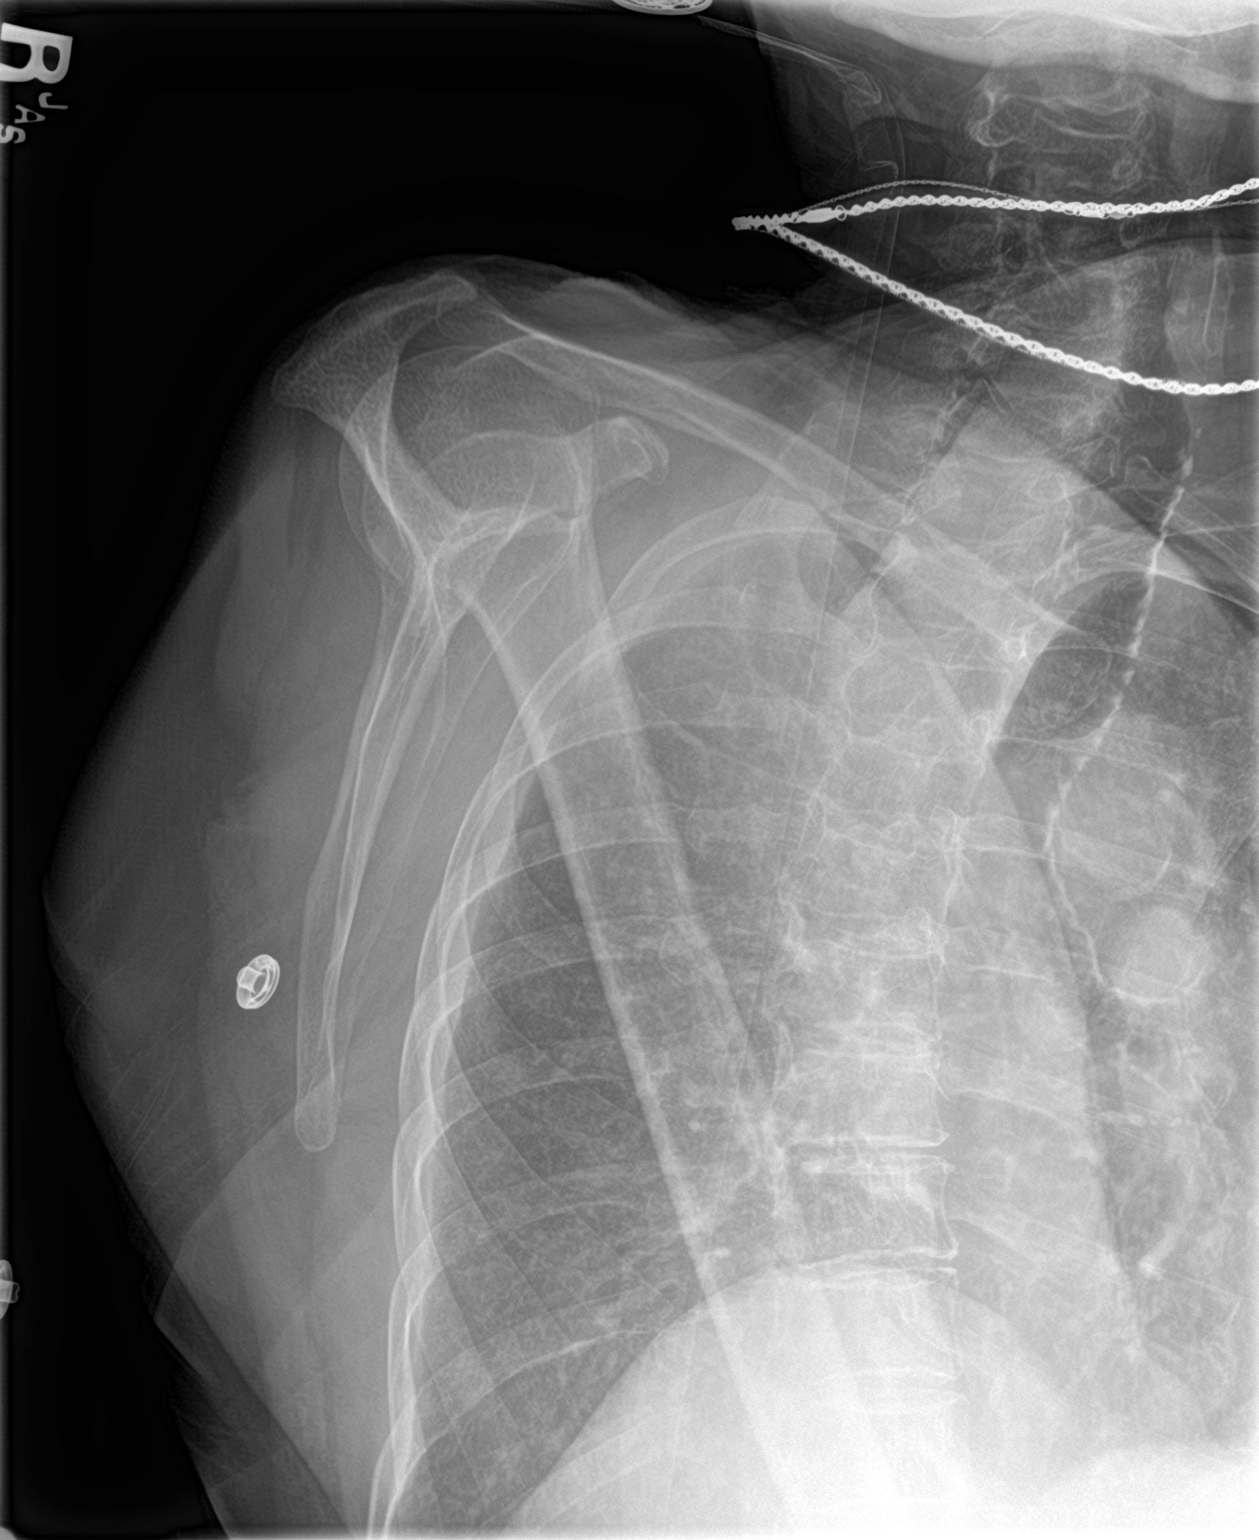

[2 of 2 positions shown; findings below may reference images not displayed]

FINDINGS: There is no evidence of fracture or dislocation. The right humeral
head is seated within the glenoid fossa. The acromioclavicular joint
is unremarkable in appearance. No significant soft tissue
abnormalities are seen. The visualized portions of the right lung
are clear.
IMPRESSION: No evidence of fracture or dislocation.
